# Patient Record
Sex: Male | Born: 1975 | Race: White | Hispanic: No | Marital: Married | Smoking: Current every day smoker
Health system: Southern US, Community
[De-identification: ages and names within clinical notes are randomized; demographics above are authoritative.]

## PROBLEM LIST (undated history)

## (undated) DIAGNOSIS — R51 Headache: Secondary | ICD-10-CM

## (undated) DIAGNOSIS — R519 Other chronic pain: Secondary | ICD-10-CM

## (undated) DIAGNOSIS — G8929 Other chronic pain: Secondary | ICD-10-CM

## (undated) HISTORY — PX: KNEE SURGERY: SHX244

## (undated) HISTORY — PX: VASECTOMY: SHX75

---

## 2000-02-03 ENCOUNTER — Encounter: Admission: RE | Admit: 2000-02-03 | Discharge: 2000-03-14 | Payer: Self-pay | Admitting: *Deleted

## 2005-02-08 ENCOUNTER — Emergency Department (HOSPITAL_COMMUNITY): Admission: EM | Admit: 2005-02-08 | Discharge: 2005-02-08 | Payer: Self-pay | Admitting: Emergency Medicine

## 2010-10-07 ENCOUNTER — Emergency Department (HOSPITAL_COMMUNITY)
Admission: EM | Admit: 2010-10-07 | Discharge: 2010-10-07 | Payer: Self-pay | Source: Home / Self Care | Admitting: Emergency Medicine

## 2010-10-07 LAB — POCT CARDIAC MARKERS
CKMB, poc: <1 ng/mL — ABNORMAL LOW (ref 1.0–8.0)
Myoglobin, poc: 48.5 ng/mL (ref 12–200)

## 2010-10-07 LAB — DIFFERENTIAL
Basophils Absolute: 0 10*3/uL (ref 0.0–0.1)
Basophils Relative: 0 % (ref 0–1)
Eosinophils Absolute: 0.1 10*3/uL (ref 0.0–0.7)
Lymphocytes Relative: 24 % (ref 12–46)
Monocytes Absolute: 0.3 10*3/uL (ref 0.1–1.0)
Monocytes Relative: 7 % (ref 3–12)
Neutrophils Relative %: 68 % (ref 43–77)

## 2010-10-07 LAB — CBC
HCT: 41.2 % (ref 39.0–52.0)
Hemoglobin: 14.4 g/dL (ref 13.0–17.0)
MCHC: 35 g/dL (ref 30.0–36.0)
MCV: 85.1 fL (ref 78.0–100.0)

## 2010-10-07 LAB — BASIC METABOLIC PANEL
BUN: 12 mg/dL (ref 6–23)
Potassium: 4 mEq/L (ref 3.5–5.1)
Sodium: 139 mEq/L (ref 135–145)

## 2010-10-14 DIAGNOSIS — R079 Chest pain, unspecified: Secondary | ICD-10-CM

## 2014-05-14 ENCOUNTER — Ambulatory Visit (HOSPITAL_COMMUNITY)
Admission: RE | Admit: 2014-05-14 | Discharge: 2014-05-14 | Disposition: A | Discharge status: Home / Self Care | Payer: BC Managed Care – PPO | Source: Ambulatory Visit | Attending: Internal Medicine | Admitting: Internal Medicine

## 2014-05-14 ENCOUNTER — Other Ambulatory Visit (HOSPITAL_COMMUNITY): Payer: Self-pay | Admitting: Internal Medicine

## 2014-05-14 DIAGNOSIS — M25469 Effusion, unspecified knee: Secondary | ICD-10-CM | POA: Insufficient documentation

## 2014-05-14 DIAGNOSIS — M25561 Pain in right knee: Secondary | ICD-10-CM

## 2014-05-14 DIAGNOSIS — M25569 Pain in unspecified knee: Secondary | ICD-10-CM | POA: Insufficient documentation

## 2015-07-10 ENCOUNTER — Ambulatory Visit: Payer: BLUE CROSS/BLUE SHIELD

## 2015-07-10 ENCOUNTER — Ambulatory Visit (INDEPENDENT_AMBULATORY_CARE_PROVIDER_SITE_OTHER): Payer: BLUE CROSS/BLUE SHIELD | Admitting: *Deleted

## 2015-07-10 DIAGNOSIS — Z23 Encounter for immunization: Secondary | ICD-10-CM | POA: Diagnosis not present

## 2017-04-24 DIAGNOSIS — F17200 Nicotine dependence, unspecified, uncomplicated: Secondary | ICD-10-CM | POA: Insufficient documentation

## 2017-04-24 DIAGNOSIS — G4489 Other headache syndrome: Secondary | ICD-10-CM | POA: Insufficient documentation

## 2017-04-24 DIAGNOSIS — R51 Headache: Secondary | ICD-10-CM | POA: Diagnosis present

## 2017-04-25 ENCOUNTER — Emergency Department (HOSPITAL_COMMUNITY)
Admission: EM | Admit: 2017-04-25 | Discharge: 2017-04-25 | Disposition: A | Discharge status: Home / Self Care | Payer: BLUE CROSS/BLUE SHIELD | Attending: Emergency Medicine | Admitting: Emergency Medicine

## 2017-04-25 ENCOUNTER — Encounter (HOSPITAL_COMMUNITY): Payer: Self-pay | Admitting: *Deleted

## 2017-04-25 ENCOUNTER — Emergency Department (HOSPITAL_COMMUNITY): Payer: BLUE CROSS/BLUE SHIELD

## 2017-04-25 DIAGNOSIS — G4489 Other headache syndrome: Secondary | ICD-10-CM

## 2017-04-25 HISTORY — DX: Other chronic pain: G89.29

## 2017-04-25 HISTORY — DX: Other chronic pain: R51.9

## 2017-04-25 HISTORY — DX: Headache: R51

## 2017-04-25 MED ORDER — DIPHENHYDRAMINE HCL 50 MG/ML IJ SOLN
25.0000 mg | Freq: Once | INTRAMUSCULAR | Status: AC
  Administered 2017-04-25: 25 mg via INTRAVENOUS
  Filled 2017-04-25: qty 1

## 2017-04-25 MED ORDER — METOCLOPRAMIDE HCL 5 MG/ML IJ SOLN
10.0000 mg | Freq: Once | INTRAMUSCULAR | Status: AC
  Administered 2017-04-25: 10 mg via INTRAVENOUS
  Filled 2017-04-25: qty 2

## 2017-04-25 NOTE — ED Notes (Signed)
Patient transported to CT 

## 2017-04-25 NOTE — ED Notes (Signed)
Pt returned from ct,  

## 2017-04-25 NOTE — ED Notes (Signed)
Dr Bebe Shaggywickline in prior to RN, see edp assessment for further

## 2017-04-25 NOTE — ED Notes (Signed)
ED Provider at bedside. 

## 2017-04-25 NOTE — ED Triage Notes (Signed)
Pt c/o severe headache that started x 1 hour ago; pt states he took ibuprofen and phenergan with no relief

## 2017-04-25 NOTE — ED Notes (Signed)
Pt c/o headache that started hour and half ago unrelieved with ibuprofen and phenergan,

## 2017-04-25 NOTE — ED Provider Notes (Signed)
[facility]Y DEPT Provider Note   CSN: [id number] Arrival date & time: 04/24/17  2352     History   Chief Complaint Chief Complaint  Patient presents with  . Headache    HPI Julian Kennedy is a 41 y.o. male.  The history is provided by the patient and the spouse.  Headache   This is a new problem. The current episode started 1 to 2 hours ago. The problem occurs constantly. The problem has been gradually improving. The pain is located in the frontal region. The quality of the pain is described as sharp. The pain is severe. The pain does not radiate. Associated symptoms include nausea. Pertinent negatives include no fever and no vomiting.  patient reports he went to bed early, and around 10pm he woke up with severe HA.  He reports he gets headaches frequently (1-2/week) but he has never experienced this type of HA No fever/vomiting No focal weakness No recent travel No tick bites or rash   Past Medical History:  Diagnosis Date  . Chronic headaches     There are no active problems to display for this patient.   Past Surgical History:  Procedure Laterality Date  . KNEE SURGERY Right   . VASECTOMY         Home Medications    Prior to Admission medications   Not on File    Family History History reviewed. No pertinent family history.  Social History Social History  Substance Use Topics  . Smoking status: Current Every Day Smoker    Packs/day: 0.50  . Smokeless tobacco: Never Used  . Alcohol use Yes     Comment: once a day     Allergies   Patient has no known allergies.   Review of Systems Review of Systems  Constitutional: Negative for fever.  Eyes: Positive for photophobia. Negative for visual disturbance.  Cardiovascular: Negative for chest pain.  Gastrointestinal: Positive for nausea. Negative for abdominal pain and vomiting.  Neurological: Positive for headaches. Negative for weakness.  All other systems reviewed and are  negative.    Physical Exam Updated Vital Signs BP 122/89 (BP Location: Right Arm)   Pulse 75   Temp 98 F (36.7 C) (Oral)   Resp 16   Ht 1.829 m (6')   Wt 97.5 kg (215 lb)   SpO2 96%   BMI 29.16 kg/m   Physical Exam  CONSTITUTIONAL: Well developed/well nourished HEAD: Normocephalic/atraumatic EYES: EOMI/PERRL, no nystagmus, no ptosis ENMT: Mucous membranes moist NECK: supple no meningeal signs, no bruits SPINE/BACK:entire spine nontender CV: S1/S2 noted, no murmurs/rubs/gallops noted LUNGS: Lungs are clear to auscultation bilaterally, no apparent distress ABDOMEN: soft, nontender, no rebound or guarding GU:no cva tenderness NEURO:Awake/alert, face symmetric, no arm or leg drift is noted Equal 5/5 strength with shoulder abduction, elbow flex/extension, wrist flex/extension in upper extremities and equal hand grips bilaterally Equal 5/5 strength with hip flexion,knee flex/extension, foot dorsi/plantar flexion Cranial nerves 3/4/5/6/03/21/09/11/12 tested and intact Gait normal without ataxia No past pointing Sensation to light touch intact in all extremities EXTREMITIES: pulses normal, full ROM SKIN: warm, color normal PSYCH: no abnormalities of mood noted, alert and oriented to situation   ED Treatments / Results  Labs (all labs ordered are listed, but only abnormal results are displayed) Labs Reviewed - No data to display  EKG  EKG Interpretation None       Radiology Ct Head Wo Contrast  Result Date: 04/25/2017 CLINICAL DATA:  Initial evaluation for acute severe headache. EXAM: CT  HEAD WITHOUT CONTRAST TECHNIQUE: Contiguous axial images were obtained from the base of the skull through the vertex without intravenous contrast. COMPARISON:  None available. FINDINGS: Brain: Cerebral volume within normal limits for patient age. No evidence for acute intracranial hemorrhage. No findings to suggest acute large vessel territory infarct. No mass lesion, midline shift, or  mass effect. Ventricles are normal in size without evidence for hydrocephalus. No extra-axial fluid collection identified. Vascular: No hyperdense vessel identified. Skull: Scalp soft tissues demonstrate no acute abnormality.Calvarium intact. Sinuses/Orbits: Globes and orbital soft tissues are within normal limits. Visualized paranasal sinuses are clear. No mastoid effusion. IMPRESSION: Normal head CT.  No acute intracranial process identified. Electronically Signed   By: Rise Mu M.D.   On: 04/25/2017 01:23    Procedures Procedures   Medications Ordered in ED Medications  metoCLOPramide (REGLAN) injection 10 mg (10 mg Intravenous Given 04/25/17 0058)  diphenhydrAMINE (BENADRYL) injection 25 mg (25 mg Intravenous Given 04/25/17 0059)     Initial Impression / Assessment and Plan / ED Course  I have reviewed the triage vital signs and the nursing notes.  Pertinent  imaging results that were available during my care of the patient were reviewed by me and considered in my medical decision making (see chart for details).     12:59 AM Pt with abrupt onset of HA that he has never had before Pt appears uncomfortable Will perform CT head   CT head negative Pt improved No focal neuro deficits I feel he is appropriate for d/c home We discussed strict ER return precautions I don't feel LP required to evaluate for [facility] as pt with HA < 6hrs ago with negative CT head   Final Clinical Impressions(s) / ED Diagnoses   Final diagnoses:  Other headache syndrome    New Prescriptions New Prescriptions   No medications on file     Zadie Rhine, MD 04/25/17 [phone]

## 2017-04-25 NOTE — Discharge Instructions (Signed)

## 2017-06-06 ENCOUNTER — Ambulatory Visit (INDEPENDENT_AMBULATORY_CARE_PROVIDER_SITE_OTHER): Payer: BLUE CROSS/BLUE SHIELD | Admitting: Neurology

## 2017-06-06 ENCOUNTER — Encounter: Payer: Self-pay | Admitting: Neurology

## 2017-06-06 VITALS — BP 116/71 | HR 79 | Ht 72.0 in | Wt 211.0 lb

## 2017-06-06 DIAGNOSIS — R51 Headache: Secondary | ICD-10-CM

## 2017-06-06 DIAGNOSIS — E236 Other disorders of pituitary gland: Secondary | ICD-10-CM | POA: Diagnosis not present

## 2017-06-06 DIAGNOSIS — R519 Headache, unspecified: Secondary | ICD-10-CM | POA: Insufficient documentation

## 2017-06-06 MED ORDER — ONDANSETRON 4 MG PO TBDP: 4.0000 mg | ORAL_TABLET | Freq: Three times a day (TID) | ORAL | 6 refills | Status: AC | PRN

## 2017-06-06 MED ORDER — NORTRIPTYLINE HCL 25 MG PO CAPS: 50.0000 mg | ORAL_CAPSULE | Freq: Every day | ORAL | 11 refills | Status: DC

## 2017-06-06 MED ORDER — RIZATRIPTAN BENZOATE 10 MG PO TBDP: 10.0000 mg | ORAL_TABLET | ORAL | 6 refills | Status: AC | PRN
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
## 2017-06-07 LAB — CBC WITH DIFFERENTIAL
BASOS: 0 %
Basophils Absolute: 0 10*3/uL (ref 0.0–0.2)
EOS (ABSOLUTE): 0 10*3/uL (ref 0.0–0.4)
Eos: 0 %
Hematocrit: 44.5 % (ref 37.5–51.0)
Hemoglobin: 14.6 g/dL (ref 13.0–17.7)
Immature Grans (Abs): 0 10*3/uL (ref 0.0–0.1)
Immature Granulocytes: 0 %
LYMPHS ABS: 1.4 10*3/uL (ref 0.7–3.1)
Lymphs: 26 %
MCH: 29 pg (ref 26.6–33.0)
MCHC: 32.8 g/dL (ref 31.5–35.7)
MCV: 88 fL (ref 79–97)
MONOS ABS: 0.3 10*3/uL (ref 0.1–0.9)
Monocytes: 6 %
NEUTROS ABS: 3.5 10*3/uL (ref 1.4–7.0)
NEUTROS PCT: 68 %
RBC: 5.04 x10E6/uL (ref 4.14–5.80)
RDW: 15.4 % (ref 12.3–15.4)
WBC: 5.2 10*3/uL (ref 3.4–10.8)

## 2017-06-07 LAB — COMPREHENSIVE METABOLIC PANEL
ALBUMIN: 5 g/dL (ref 3.5–5.5)
ALK PHOS: 50 IU/L (ref 39–117)
ALT: 23 IU/L (ref 0–44)
AST: 19 IU/L (ref 0–40)
Albumin/Globulin Ratio: 2.4 — ABNORMAL HIGH (ref 1.2–2.2)
BUN / CREAT RATIO: 12 (ref 9–20)
BUN: 13 mg/dL (ref 6–20)
Bilirubin Total: 0.2 mg/dL (ref 0.0–1.2)
CO2: 21 mmol/L (ref 20–29)
CREATININE: 1.13 mg/dL (ref 0.76–1.27)
Calcium: 9.5 mg/dL (ref 8.7–10.2)
Chloride: 105 mmol/L (ref 96–106)
GFR, EST AFRICAN AMERICAN: 94 mL/min/{1.73_m2} (ref >59)
GFR, EST NON AFRICAN AMERICAN: 81 mL/min/{1.73_m2} (ref >59)
GLOBULIN, TOTAL: 2.1 g/dL (ref 1.5–4.5)
Glucose: 81 mg/dL (ref 65–99)
Potassium: 5 mmol/L (ref 3.5–5.2)
SODIUM: 142 mmol/L (ref 134–144)
Total Protein: 7.1 g/dL (ref 6.0–8.5)

## 2017-06-07 LAB — RPR: RPR Ser Ql: NONREACTIVE

## 2017-06-07 LAB — TSH: TSH: 1.55 u[IU]/mL (ref 0.450–4.500)

## 2017-06-07 LAB — VITAMIN B12: Vitamin B-12: 321 pg/mL (ref 232–1245)

## 2017-06-07 LAB — C-REACTIVE PROTEIN: CRP: 1.2 mg/L (ref 0.0–4.9)

## 2017-06-07 LAB — SEDIMENTATION RATE: Sed Rate: 2 mm/hr (ref 0–15)

## 2017-08-01 ENCOUNTER — Other Ambulatory Visit: Payer: Self-pay | Admitting: *Deleted

## 2017-08-01 MED ORDER — NORTRIPTYLINE HCL 25 MG PO CAPS: 50.0000 mg | ORAL_CAPSULE | Freq: Every day | ORAL | 3 refills | Status: AC

## 2017-08-11 ENCOUNTER — Telehealth: Payer: Self-pay | Admitting: *Deleted

## 2017-08-11 ENCOUNTER — Ambulatory Visit: Payer: BLUE CROSS/BLUE SHIELD | Admitting: Neurology

## 2017-08-11 NOTE — Telephone Encounter (Signed)
No showed follow up appointment. 

## 2017-08-12 ENCOUNTER — Encounter: Payer: Self-pay | Admitting: Neurology

## 2018-10-10 IMAGING — CT CT HEAD W/O CM
3 series · 15 of 47 positions shown, 18 images · non-contrast
Comparison: None available.

CLINICAL DATA: Initial evaluation for acute severe headache.

EXAM:
CT HEAD WITHOUT CONTRAST
TECHNIQUE: Contiguous axial images were obtained from the base of the skull
through the vertex without intravenous contrast.

[Series 2: head trauma wo · axial · 0.45mm/px · z∈[-4,+131]mm · 9 of 33 slices shown, 12 images]
[im 3/33  brain]
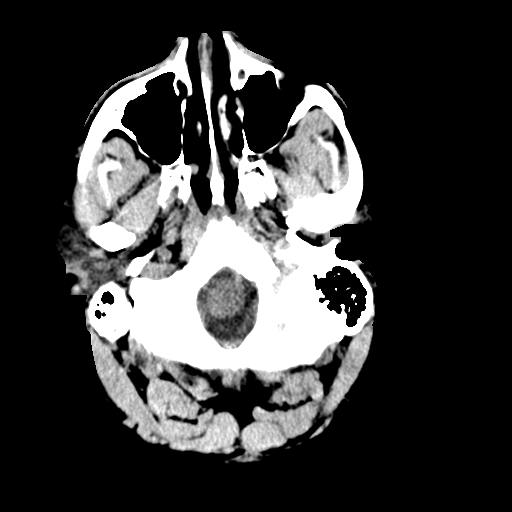
[im 3/33  bone]
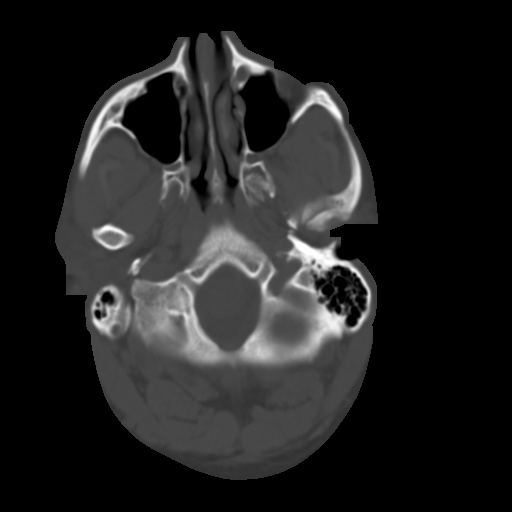
[im 6/33  brain]
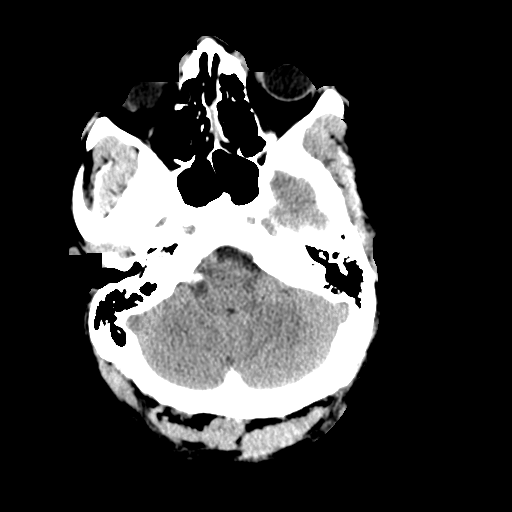
[im 9/33  brain]
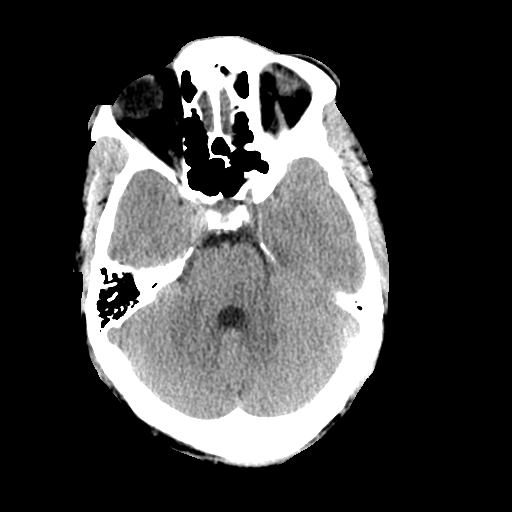
[im 13/33  brain]
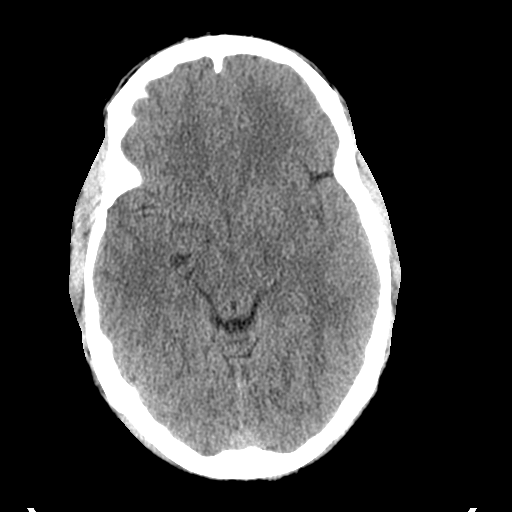
[im 17/33  brain]
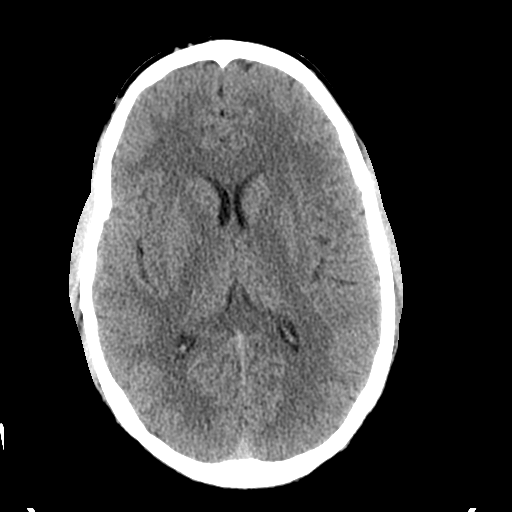
[im 17/33  bone]
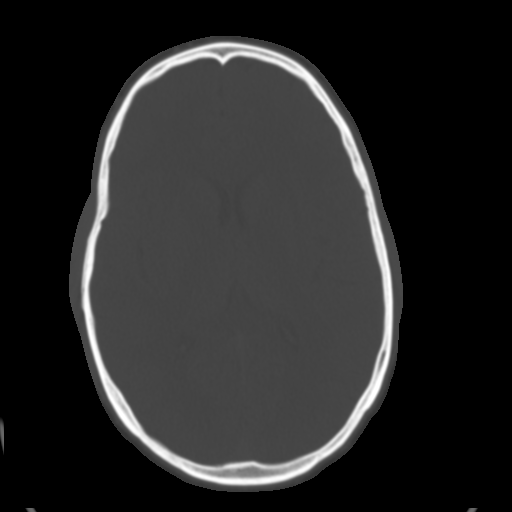
[im 20/33  brain]
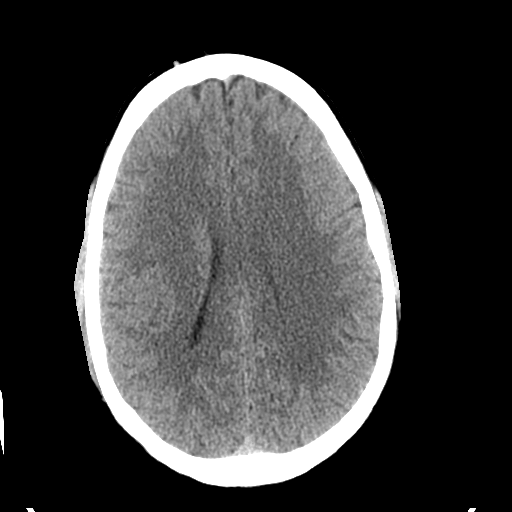
[im 24/33  brain]
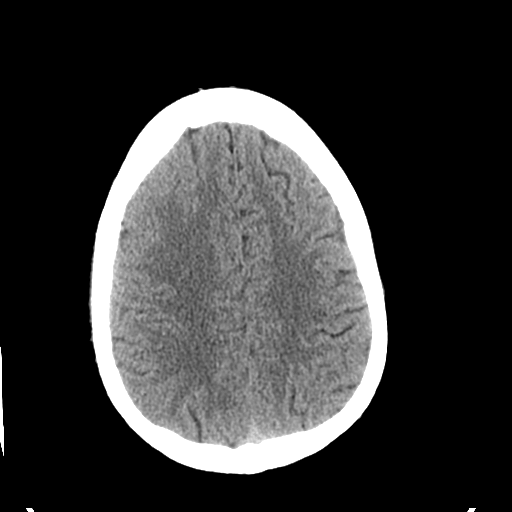
[im 27/33  brain]
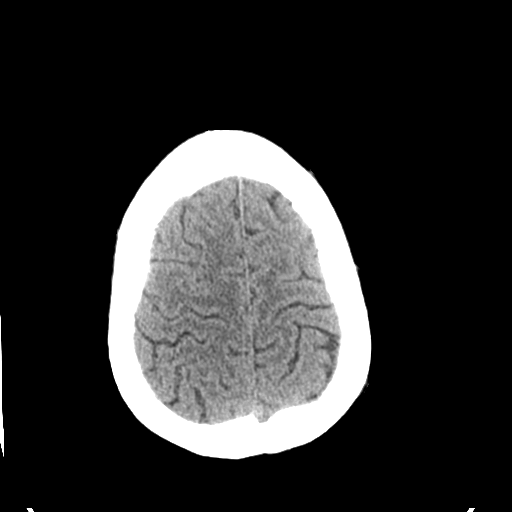
[im 30/33  brain]
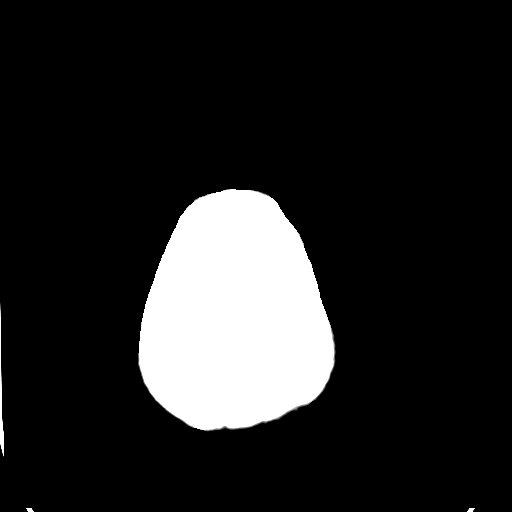
[im 30/33  bone]
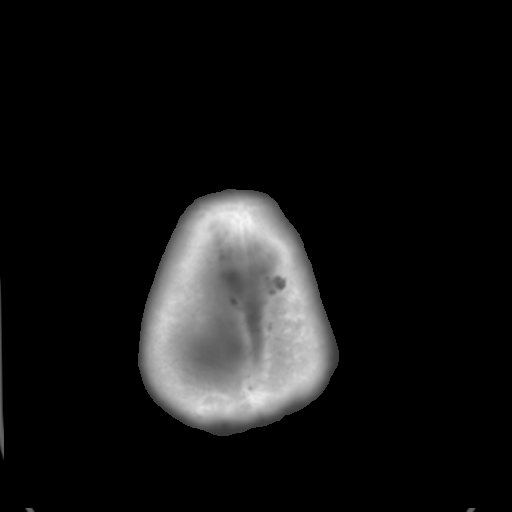

[Series 4: coronal soft tissue · coronal · 0.35mm/px · 3 of 73 slices shown]
[im 25/73  brain]
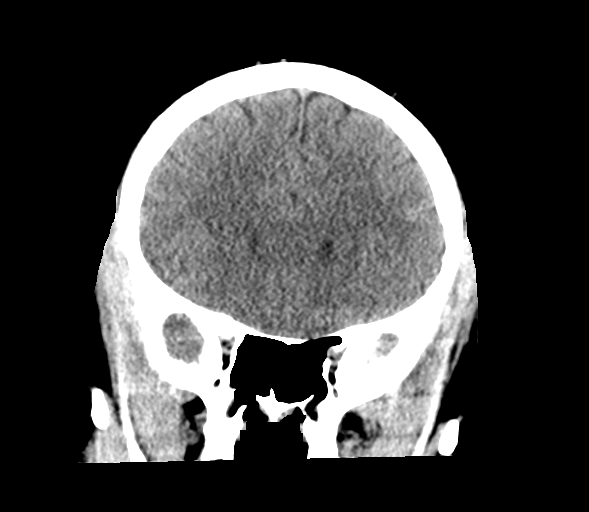
[im 33/73  brain]
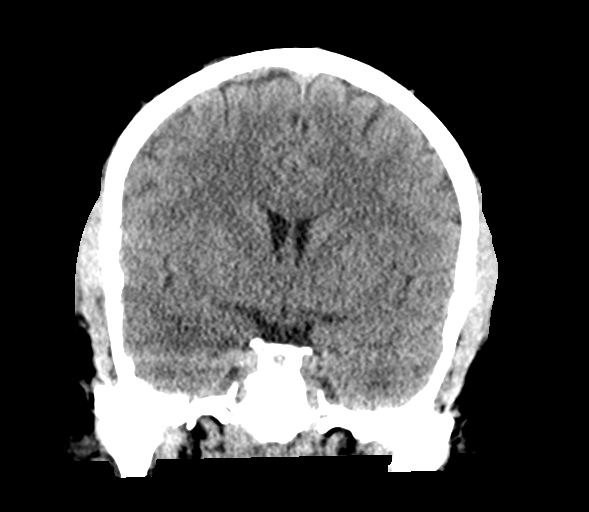
[im 41/73  brain]
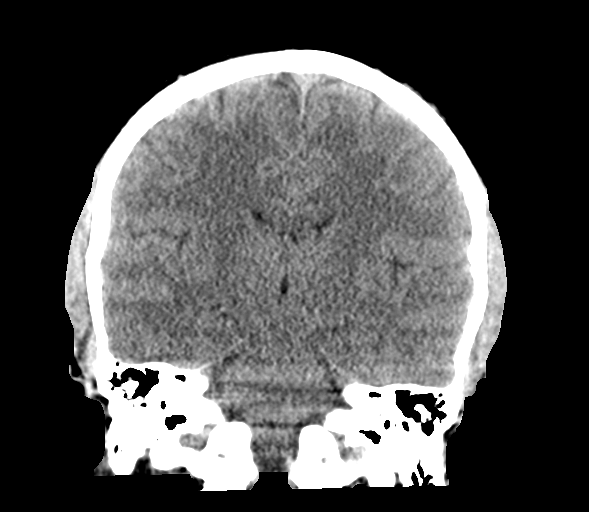

[Series 5: sagittal soft tissue · sagittal · 0.33mm/px · 3 of 56 slices shown]
[im 19/56  brain]
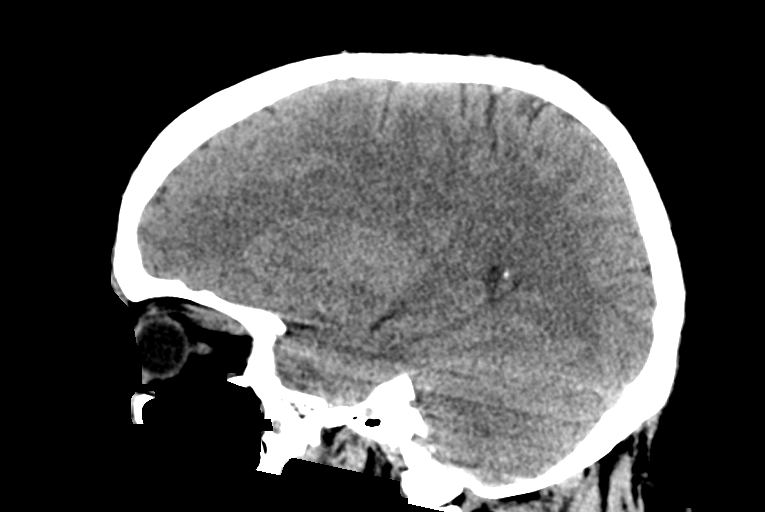
[im 28/56  brain]
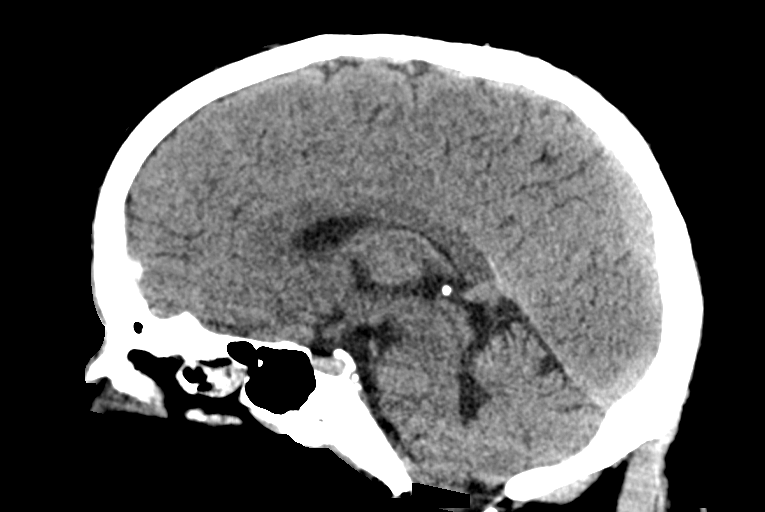
[im 37/56  brain]
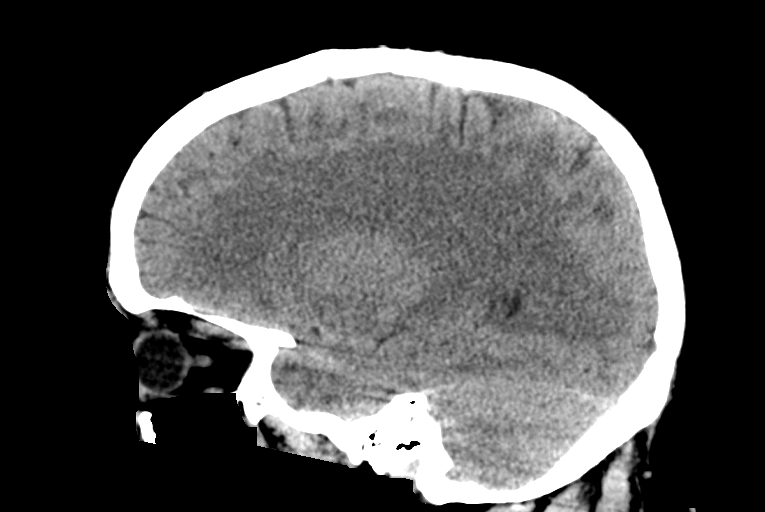

[15 of 47 positions shown; findings below may reference images not displayed]

FINDINGS: Brain: Cerebral volume within normal limits for patient age.

No evidence for acute intracranial hemorrhage. No findings to
suggest acute large vessel territory infarct. No mass lesion,
midline shift, or mass effect. Ventricles are normal in size without
evidence for hydrocephalus. No extra-axial fluid collection
identified.

Vascular: No hyperdense vessel identified.

Skull: Scalp soft tissues demonstrate no acute abnormality.Calvarium
intact.

Sinuses/Orbits: Globes and orbital soft tissues are within normal
limits.

Visualized paranasal sinuses are clear. No mastoid effusion.
IMPRESSION: Normal head CT.  No acute intracranial process identified.
